# Patient Record
Sex: Female | Born: 1935 | Race: White | Hispanic: No
Health system: Southern US, Community
[De-identification: ages and names within clinical notes are randomized; demographics above are authoritative.]

---

## 1998-01-04 ENCOUNTER — Inpatient Hospital Stay (HOSPITAL_COMMUNITY): Admission: AD | Admit: 1998-01-04 | Discharge: 1998-01-08 | Payer: Self-pay | Admitting: Cardiology

## 2000-01-27 ENCOUNTER — Ambulatory Visit (HOSPITAL_COMMUNITY): Admission: RE | Admit: 2000-01-27 | Discharge: 2000-01-27 | Payer: Self-pay | Admitting: Orthopedic Surgery

## 2000-10-21 ENCOUNTER — Encounter: Payer: Self-pay | Admitting: Internal Medicine

## 2000-10-22 ENCOUNTER — Inpatient Hospital Stay (HOSPITAL_COMMUNITY): Admission: AD | Admit: 2000-10-22 | Discharge: 2000-10-23 | Payer: Self-pay | Admitting: Internal Medicine

## 2001-11-25 ENCOUNTER — Encounter: Payer: Self-pay | Admitting: Internal Medicine

## 2001-11-25 ENCOUNTER — Ambulatory Visit (HOSPITAL_COMMUNITY): Admission: RE | Admit: 2001-11-25 | Discharge: 2001-11-25 | Payer: Self-pay | Admitting: Internal Medicine

## 2001-12-13 ENCOUNTER — Encounter: Payer: Self-pay | Admitting: Internal Medicine

## 2001-12-13 ENCOUNTER — Ambulatory Visit (HOSPITAL_COMMUNITY): Admission: RE | Admit: 2001-12-13 | Discharge: 2001-12-13 | Payer: Self-pay | Admitting: Internal Medicine

## 2002-10-29 ENCOUNTER — Emergency Department (HOSPITAL_COMMUNITY): Admission: EM | Admit: 2002-10-29 | Discharge: 2002-10-29 | Payer: Self-pay | Admitting: Emergency Medicine

## 2003-09-06 ENCOUNTER — Inpatient Hospital Stay (HOSPITAL_COMMUNITY): Admission: AD | Admit: 2003-09-06 | Discharge: 2003-09-09 | Payer: Self-pay | Admitting: Internal Medicine

## 2004-09-17 ENCOUNTER — Ambulatory Visit (HOSPITAL_COMMUNITY): Admission: RE | Admit: 2004-09-17 | Discharge: 2004-09-17 | Payer: Self-pay | Admitting: Internal Medicine

## 2005-01-21 ENCOUNTER — Ambulatory Visit (HOSPITAL_COMMUNITY): Admission: RE | Admit: 2005-01-21 | Discharge: 2005-01-21 | Payer: Self-pay | Admitting: Internal Medicine

## 2005-02-12 ENCOUNTER — Inpatient Hospital Stay (HOSPITAL_COMMUNITY): Admission: EM | Admit: 2005-02-12 | Discharge: 2005-02-18 | Payer: Self-pay | Admitting: *Deleted

## 2005-03-15 ENCOUNTER — Ambulatory Visit (HOSPITAL_COMMUNITY): Admission: RE | Admit: 2005-03-15 | Discharge: 2005-03-15 | Payer: Self-pay | Admitting: Internal Medicine

## 2005-04-08 ENCOUNTER — Ambulatory Visit (HOSPITAL_COMMUNITY): Admission: RE | Admit: 2005-04-08 | Discharge: 2005-04-08 | Payer: Self-pay | Admitting: Internal Medicine

## 2005-04-24 ENCOUNTER — Other Ambulatory Visit: Admission: RE | Admit: 2005-04-24 | Discharge: 2005-04-24 | Payer: Self-pay | Admitting: Dermatology

## 2006-02-15 ENCOUNTER — Emergency Department (HOSPITAL_COMMUNITY): Admission: EM | Admit: 2006-02-15 | Discharge: 2006-02-15 | Payer: Self-pay | Admitting: Emergency Medicine

## 2006-02-20 ENCOUNTER — Ambulatory Visit (HOSPITAL_COMMUNITY): Admission: RE | Admit: 2006-02-20 | Discharge: 2006-02-20 | Payer: Self-pay | Admitting: Internal Medicine

## 2006-07-20 ENCOUNTER — Ambulatory Visit (HOSPITAL_COMMUNITY): Admission: RE | Admit: 2006-07-20 | Discharge: 2006-07-20 | Payer: Self-pay | Admitting: Internal Medicine

## 2007-01-22 ENCOUNTER — Ambulatory Visit (HOSPITAL_COMMUNITY): Admission: RE | Admit: 2007-01-22 | Discharge: 2007-01-22 | Payer: Self-pay | Admitting: Internal Medicine

## 2007-02-12 ENCOUNTER — Ambulatory Visit (HOSPITAL_COMMUNITY): Admission: RE | Admit: 2007-02-12 | Discharge: 2007-02-12 | Payer: Self-pay | Admitting: Internal Medicine

## 2007-03-12 ENCOUNTER — Ambulatory Visit (HOSPITAL_COMMUNITY): Admission: RE | Admit: 2007-03-12 | Discharge: 2007-03-12 | Payer: Self-pay | Admitting: Pulmonary Disease

## 2007-05-30 ENCOUNTER — Inpatient Hospital Stay (HOSPITAL_COMMUNITY): Admission: EM | Admit: 2007-05-30 | Discharge: 2007-06-05 | Payer: Self-pay | Admitting: Emergency Medicine

## 2008-07-21 IMAGING — CT CT ANGIO CHEST
2 of 4 series · 19 of 36 positions shown · IV contrast (CONTRAST)
Comparison: CT chest on 02/12/05 and chest x-ray today.

CLINICAL DATA: 74-year-old with shortness of breath.  Sick for a week.   Increased D-dimer.  No prior malignancy or surgery. 
 CT ANGIOGRAPHY OF CHEST:
TECHNIQUE: Multidetector CT imaging of the chest was performed during bolus injection of intravenous contrast.  Multiplanar CT angiographic image reconstructions were generated to evaluate the vascular anatomy.
 Contrast:  150 cc Omnipaque 300.

[Series 9737: — · axial · 0.75mm/px · z∈[+1634,+1853]mm · 16 of 403 slices shown (1 of 2)]
[im 19/403  lung]
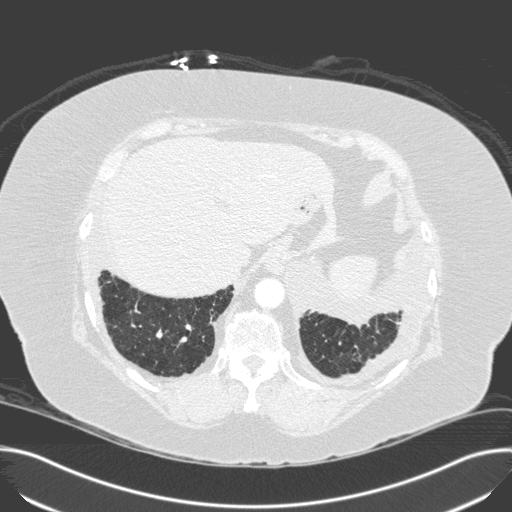
[im 37/403  mediastinal]
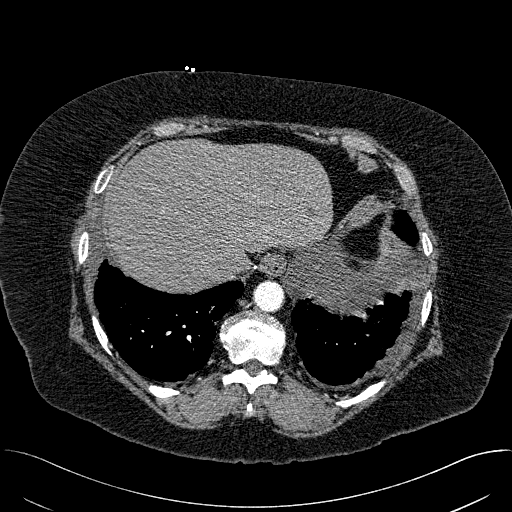
[im 74/403  lung]
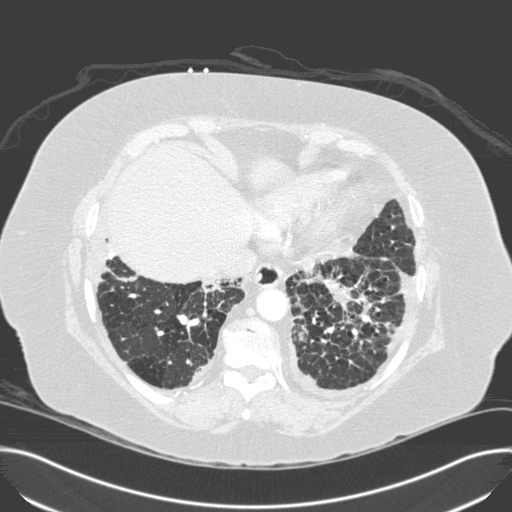
[im 92/403  mediastinal]
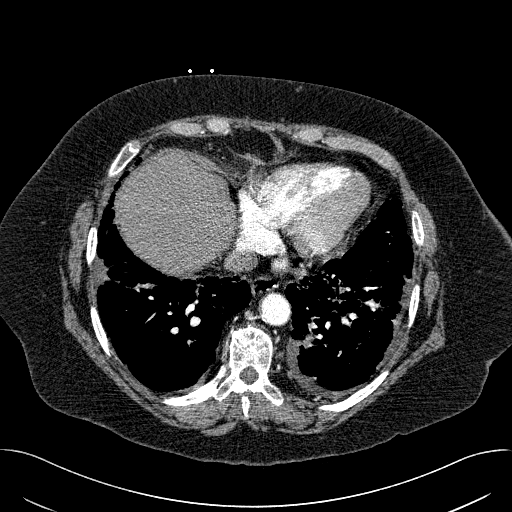
[im 110/403  lung]
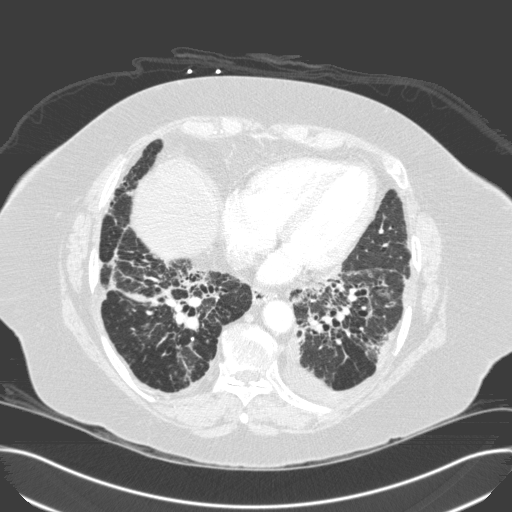
[im 147/403  mediastinal]
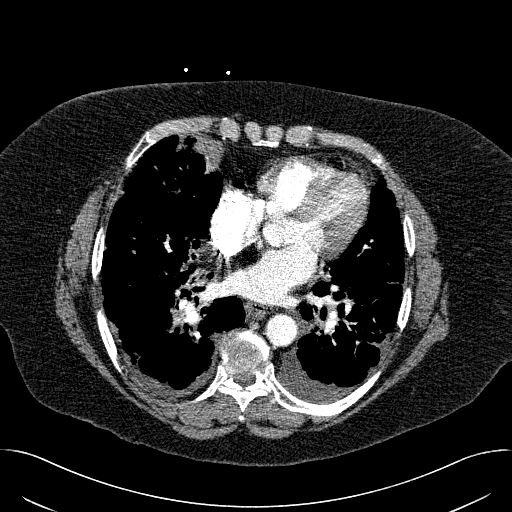
[im 165/403  lung]
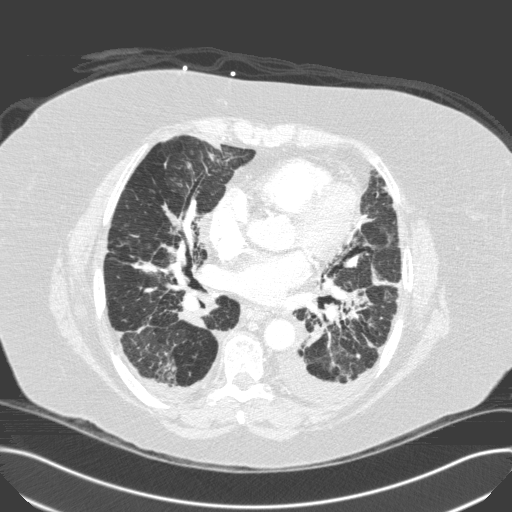
[im 183/403  mediastinal]
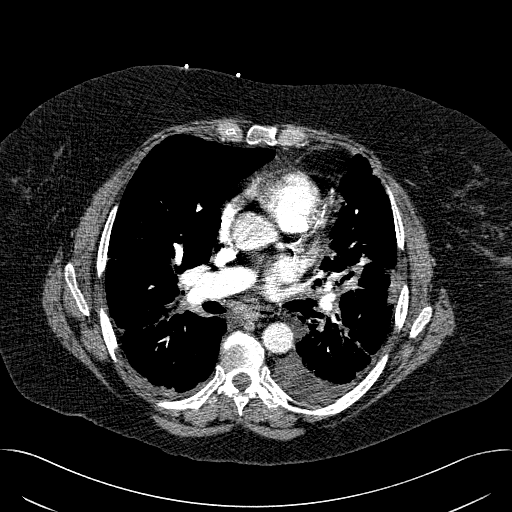
[im 220/403  lung]
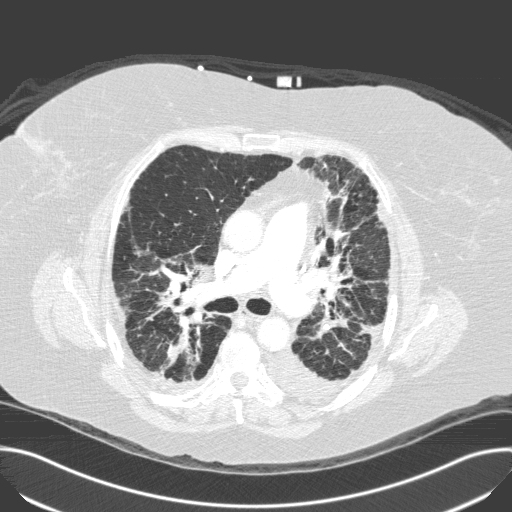
[im 238/403  mediastinal]
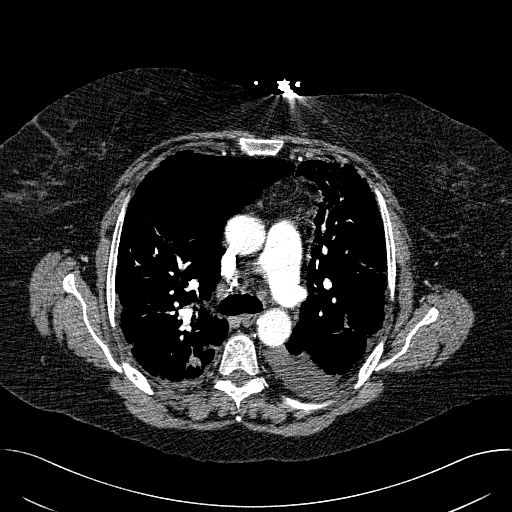
[im 256/403  lung]
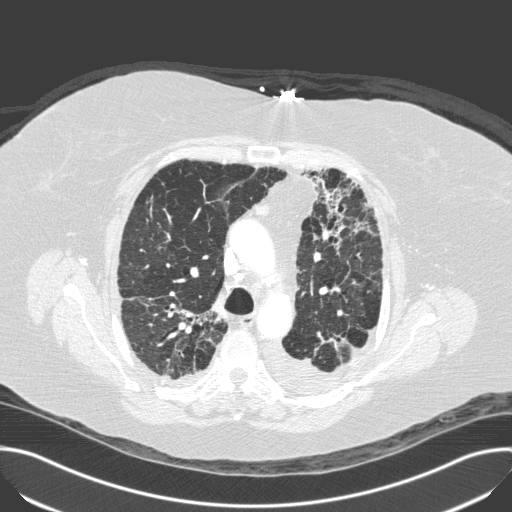
[im 293/403  mediastinal]
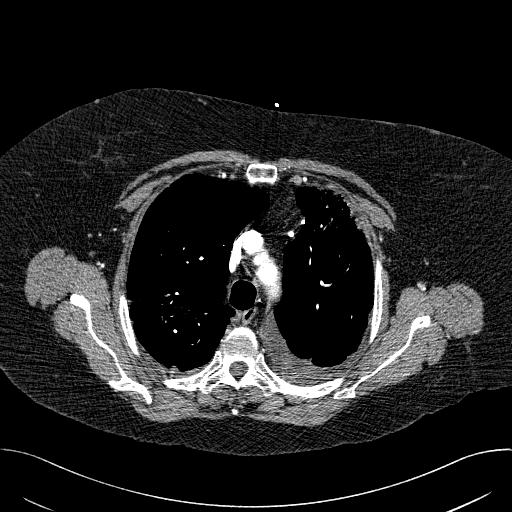
[im 311/403  lung]
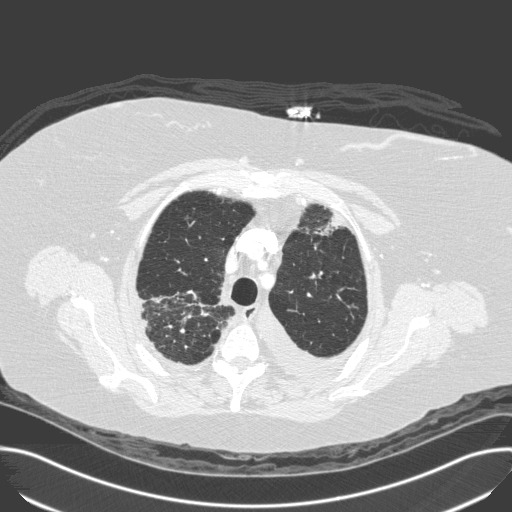
[im 329/403  mediastinal]
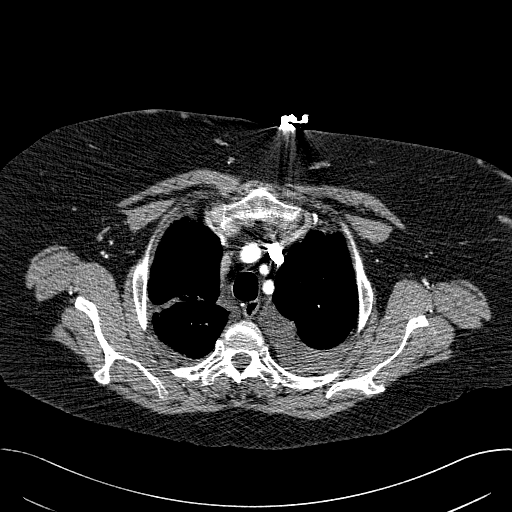
[im 366/403  lung]
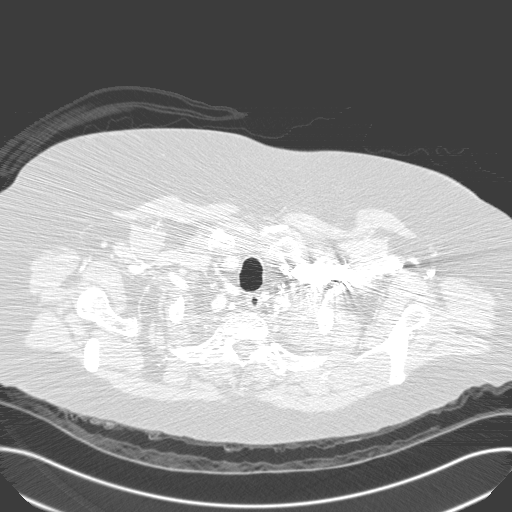
[im 384/403  mediastinal]
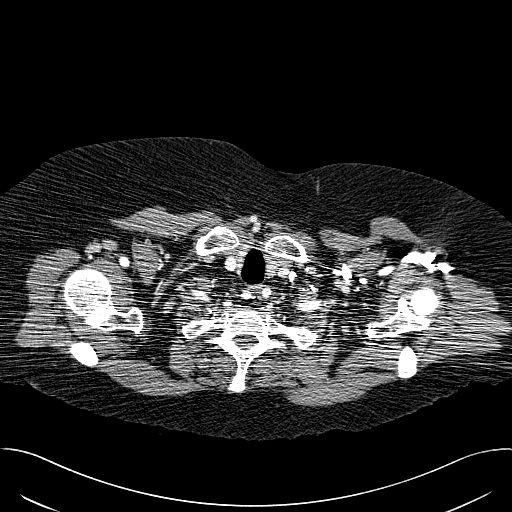

[— · coronal · 0.47mm/px · 3 of 70 slices shown (2 of 2)]
[im 14/70  mediastinal]
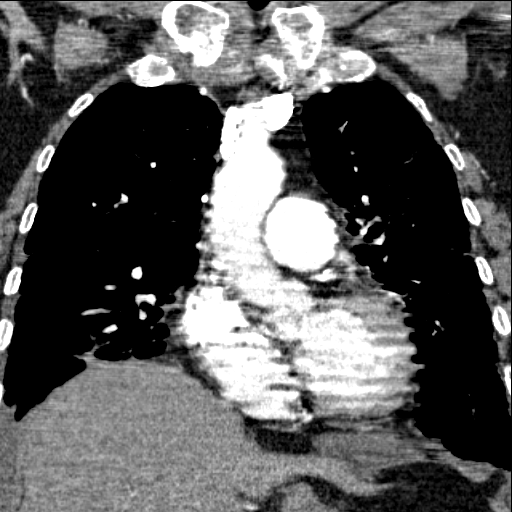
[im 28/70  mediastinal]
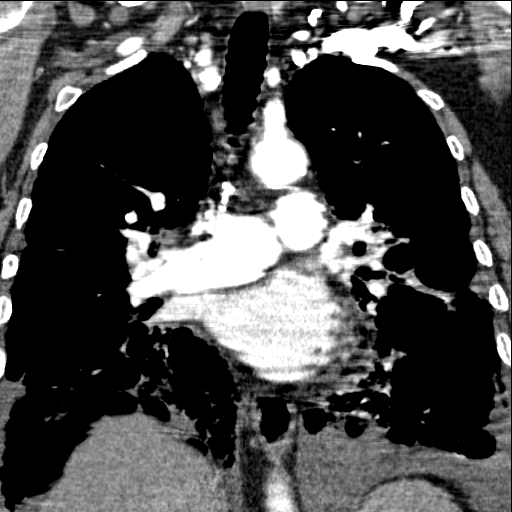
[im 42/70  mediastinal]
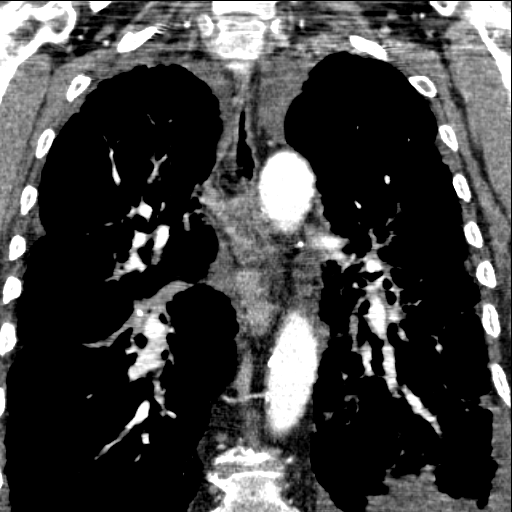

[19 of 36 positions shown; findings below may reference images not displayed]

FINDINGS: Again noted are chronic lung changes.  Bilateral pleural effusions are again noted, smaller on the left than on the previous exam.  Vessels are well opacified, and there is no evidence for acute pulmonary embolus.  Scattered, small mediastinal lymph nodes are identified.  Images of the upper abdomen are unremarkable.
IMPRESSION: 1.  Chronic lung disease. 
 2.  No evidence for acute pulmonary embolus.

## 2010-07-28 ENCOUNTER — Encounter: Payer: Self-pay | Admitting: Internal Medicine

## 2010-11-19 NOTE — Group Therapy Note (Signed)
[name], [name]                 ACCOUNT NO.:  [id number]   MEDICAL RECORD NO.:  [id number]          PATIENT TYPE:  INP   LOCATION:  IC07                          FACILITY:  [facility]   PHYSICIAN:  Edward L. Juanetta Gosling, M.D.DATE OF BIRTH:  [birth year]   DATE OF PROCEDURE:  DATE OF DISCHARGE:                                 PROGRESS NOTE   Ms. Wiseman is more alert.  She is off sedation right now.  She shakes her  head that she does not have any complaints.  Her pulse is in the 70s, O2  sat 97%, respirations 15, blood pressure 128/80.  She still has some  rhonchi.  Her heart is regular.  Her lab work:  White count is 4600,  hemoglobin is 10.4, platelets down to 80,000.  BUN is down to 41,  creatinine is 1.33 much improved.  Her blood gas this morning shows on  40% 450, rate of 12, 5 of PEEP, pH is 7.35, pCO2 of 52, pO2 of 112.  It  think that is probably about her baseline except for the oxygenation.  Chest x-ray really shows no significant change.   ASSESSMENT:  She is better.  She does have a low platelet count.  I am  going to go ahead and check heparin-associated thrombocytopenia panel.  I am going to go ahead and see if we can do any weaning.  I do not plan  to extubate her today, but I will see if she can wean at all.  She will  need to stay back on the ventilator after she is weaned some today  because of her secretions.  Otherwise she is doing better.      Edward L. Juanetta Gosling, M.D.  Electronically Signed     ELH/MEDQ  D:  06/03/2007  T:  06/03/2007  Job:  [id number]

## 2010-11-19 NOTE — Group Therapy Note (Signed)
[name], [name]                 ACCOUNT NO.:  [id number]   MEDICAL RECORD NO.:  [id number]          PATIENT TYPE:  INP   LOCATION:  IC07                          FACILITY:  [facility]   PHYSICIAN:  Edward L. Juanetta Gosling, M.D.DATE OF BIRTH:  18-Mar-1932   DATE OF PROCEDURE:  DATE OF DISCHARGE:                                 PROGRESS NOTE   SUBJECTIVE:  Ms. Hastings actually bit through her endotracheal tube balloon  today.  She was then extubated.  Since she had been extubated, she has  not been responsive and her blood gas shows on 100% O2, pH is 7.045,  pCO2 is 111, pO2 of 102.  I have discussed all this with her daughter  who is at bedside and told her that if we do not reintubate her, she is  very likely to die.  We discussed her known severe pulmonary disease and  their thought is not to reintubated and to go ahead and put her on some  BiPAP; I think that is reasonable.  She is going to discuss it with the  rest of the family in the meantime.  She has been in respiratory failure  related to sarcoidosis and a Pseudomonas pneumonia.   OBJECTIVE:  Her blood pressure is 166/95.  Her blood gas is as  mentioned.  Electrolytes show her sodium is 147, CO2 of 33, BUN of 42,  creatinine 1.04.  She is afebrile.  I&O +2368 and 646.   ASSESSMENT:  She has got severe problems of course.   PLAN:  She is to probably not reintubate, try the BiPAP and see what she  does.      Edward L. Juanetta Gosling, M.D.  Electronically Signed     ELH/MEDQ  D:  06/05/2007  T:  06/05/2007  Job:  [id number]

## 2010-11-19 NOTE — Group Therapy Note (Signed)
NAMECAROLINE, [name]                 ACCOUNT NO.:  [id number]   MEDICAL RECORD NO.:  [id number]          PATIENT TYPE:  INP   LOCATION:  IC07                          FACILITY:  [facility]   PHYSICIAN:  Edward L. Juanetta Gosling, M.D.DATE OF BIRTH:  1932/04/29   DATE OF PROCEDURE:  DATE OF DISCHARGE:                                 PROGRESS NOTE   Ms. Test is I think about the same, although her blood gas does not look  as good.  She has no new complaints.  She is coughing.  She is  congested.  She is growing gram-negative rods in her sputum.  Her chest  shows significant rhonchi.  Her heart is regular with a fairly slow  heart rate. She is now off of atenolol which probably she should stay  off of. Her heart rate is in the 60s. Blood pressure 135/80. She has  significant rhonchi.  Her blood gas this morning on BiPAP and 4 liters  of O2 shows pH 7.17, pCO2 of 95, pO2 of 85.  CBC shows white count 7300,  hemoglobin 12.5, platelets 118, BUN is 39, creatinine 2.37.   ASSESSMENT:  She has had significant change in her renal function.  She  has gram-negative rods in her sputum.  She has had very poor response to  BiPAP so I think we should go ahead and get her off of the BiPAP put her  back on nasal O2, have her do a flutter valve and see if any of this  helps.  Will get another blood gas about 2 hours after she is on the O2.  She can have some full liquids today.  We may need to add an immuno  __________ although renal function __________ because of the gram-  negative rods until we have and identification.      Edward L. Juanetta Gosling, M.D.  Electronically Signed     ELH/MEDQ  D:  06/01/2007  T:  06/01/2007  Job:  [id number]

## 2010-11-19 NOTE — Group Therapy Note (Signed)
[name], [name]                 ACCOUNT NO.:  [id number]   MEDICAL RECORD NO.:  [id number]          PATIENT TYPE:  INP   LOCATION:  IC07                          FACILITY:  [facility]   PHYSICIAN:  Edward L. Juanetta Gosling, M.D.DATE OF BIRTH:  02/25/1932   DATE OF PROCEDURE:  DATE OF DISCHARGE:                                 PROGRESS NOTE   [name] continued having difficulty yesterday and as per my written  progress note, she ended up with increasing CO2, worsening problems with  confusion and ended up having to be intubated.  She has had a large  amount of sputum suctioned.  She did receive a small amount of dopamine.  She is off now.  Blood pressure now 122/60, pulse 66, temperature is  36.4.  The patient's INR +26, 94 yesterday -82 so far.  Her exam shows  she still has significant rhonchi.  She is sedated.  Her heart is  regular.  Her laboratory work:  White count is 7300, hemoglobin 12.5,  platelets 118.  Her pH is 7.41, pCO2 of 44, pO2 of 104 on 50% 450 rate  of 15, 5 of PEEP.   ASSESSMENT:  She is better.   PLAN:  We are going to continue working on trying to get her secretions  controlled and try to get her more stable.  I do not think she will be  able to be extubated even if she has good weaning parameters because of  the problems secretions.      Edward L. Juanetta Gosling, M.D.  Electronically Signed     ELH/MEDQ  D:  06/02/2007  T:  06/02/2007  Job:  [id number]

## 2010-11-19 NOTE — Consult Note (Signed)
NAMEKEYARIA, LAWSON                 ACCOUNT NO.:  [id number]   MEDICAL RECORD NO.:  [id number]          PATIENT TYPE:  INP   LOCATION:  IC07                          FACILITY:  [facility]   PHYSICIAN:  [name], M.D.DATE OF BIRTH:  09/13/1931   DATE OF CONSULTATION:  05/31/2007  DATE OF DISCHARGE:                                 CONSULTATION   [name] is a 77 year old who has multiple medical problems.  She has  longstanding pulmonary sarcoidosis with increasing respiratory  difficulty.  She had been worse for the last month; she had been on home  oxygen.  She has had multiple change in her medications, none of which  have made a great deal of difference.  She was brought to the emergency  room.  She is on 100% O2 and she had marked CO2 retention.  She was  started on BiPAP but continued having difficulty, and eventually had to  come to the intensive care unit.   PAST MEDICAL HISTORY:  Positive for sarcoidosis, gout, hypertension; and  she has what is likely cor pulmonale.   HOME MEDICATIONS:  1. Verapamil 180 mg daily.  2. Aspirin 325 mg daily.  3. Colchicine 0.6 b.i.d.  4. Allopurinol 300 mg daily.  5. Atenolol 50 mg daily.  6. Ultracet as needed.  7. Omnicef 300 mg b.i.d.   SOCIAL HISTORY:  She does not use tobacco.  She does not use alcohol.  She does not use any illicit drugs.   REVIEW OF SYSTEMS:  She had been coughing.  She had been congested.   PHYSICAL EXAMINATION:  She is on BiPAP now.  She is able to talk a  little bit.  Her pulse is in the 60s, blood pressure 134/66,  respirations 28.  CHEST:  With some rhonchi bilaterally.  Her heart is regular.  ABDOMEN:  Abdomen is soft.  EXTREMITIES:  Showed trace to 1+ to 2+ edema bilaterally.   LABORATORY WORK:  Blood gas done at about 8 o'clock:  pH 7.24, pCO2 of  83, pO2 of 71 -- this is an improvement from earlier at 7.209, pCO2 of  93, pO2 of 36.   ASSESSMENT:  A chest x-ray shows perhaps a new infiltrate in the  right  lower lobe, which could be infection or could be progression of her  fibrotic lung disease.  BNP was up, which may be related to her lung  disease; but she may have some heart problem as well.  She is currently  on steroids, insulin, Albuterol and Atrovent, and on her other  medications as before.   Because of the concern about her possible infection, I agree with Dr.  Alonza Stephenson plan to go ahead and put her on Rocephin instead of on the oral  Ceftin that she is on now.  I will repeat another blood gas at about 2  pm.      [name]. Juanetta Stephenson, M.D.  Electronically Signed     ELH/MEDQ  D:  05/31/2007  T:  05/31/2007  Job:  [id number]

## 2010-11-19 NOTE — Group Therapy Note (Signed)
[name], [name]                 ACCOUNT NO.:  [id number]   MEDICAL RECORD NO.:  [id number]          PATIENT TYPE:  INP   LOCATION:  IC07                          FACILITY:  [facility]   PHYSICIAN:  Edward L. Juanetta Gosling, M.D.DATE OF BIRTH:  1932-02-05   DATE OF PROCEDURE:  DATE OF DISCHARGE:                                 PROGRESS NOTE   PROBLEM:  Respiratory failure, sarcoidosis with interstitial lung  disease, a Pseudomonas pneumonia.   SUBJECTIVE:  [name] remains intubated and on the ventilator.  Apparently, she had been agitated through the night and difficult to  keep her from trying to pull her endotracheal tube, etc.   Her exam shows she has significant rhonchi bilaterally.  Heart rate is  78 regular, blood pressure 147/68, respirations 13.  Her chest with  rhonchi bilaterally.  Abdomen is soft.   Her electrolytes now show a BUN of 40 and creatinine of 1.11.  Electrolytes normal.  CBC shows white count 6,100, hemoglobin 11,  platelets 83,000.   Her chest x-ray shows little change.   ASSESSMENT:  She has multiple problems including respiratory failure  that is due to pneumonia as well as her interstitial lung disease.   PLAN:  Have her take maybe some Diprivan now.  We could not give her  anything like that earlier because her blood pressure was too low.  I  will see if she is weanable; I doubt it.  We will see what she can do  and then decide about Diprivan.  I will go ahead and order a heparin-  associated thrombocytopenia profile.      Edward L. Juanetta Gosling, M.D.  Electronically Signed     ELH/MEDQ  D:  06/04/2007  T:  06/04/2007  Job:  [id number]

## 2010-11-19 NOTE — Procedures (Signed)
[name], [name]                 ACCOUNT NO.:  [id number]   MEDICAL RECORD NO.:  [id number]          PATIENT TYPE:  OUT   LOCATION:  RAD                           FACILITY:  [facility]   PHYSICIAN:  Edward L. Juanetta Gosling, M.D.DATE OF BIRTH:  [birth year]   DATE OF PROCEDURE:  03/12/2007  DATE OF DISCHARGE:  03/12/2007                            PULMONARY FUNCTION TEST   PULMONARY FUNCTION TEST:  1. Spirometry shows a severe ventilatory defect with evidence of      airflow obstruction.  2. Lung volumes show reduction of total lung capacity which is      moderate.  3. DLCO is severely reduced.  4. Arterial blood gases show resting hypoxemia and elevated pCO2 with      normal pH, suggesting chronic CO2 retention and chronic respiratory      failure.  5. After inhaled bronchodilator, there is improvement that nearly      reaches significance.      Edward L. Juanetta Gosling, M.D.  Electronically Signed     ELH/MEDQ  D:  03/15/2007  T:  03/16/2007  Job:  [id number]   cc:   Kingsley Callander. Ouida Sills, MD  Fax: [phone]

## 2010-11-19 NOTE — H&P (Signed)
NAMESHERESA, Stephenson                 ACCOUNT NO.:  [id number]   MEDICAL RECORD NO.:  [id number]          PATIENT TYPE:  INP   LOCATION:  IC07                          FACILITY:  [facility]   PHYSICIAN:  Tesfaye D. Felecia Shelling, MD   DATE OF BIRTH:  22-Apr-1932   DATE OF ADMISSION:  05/30/2007  DATE OF DISCHARGE:  LH                              HISTORY & PHYSICAL   CHIEF COMPLAINT:  Worsening shortness of breath.   HISTORY OF PRESENT ILLNESS:  This is a 77 year old female patient of Dr.  Carylon Perches with a history of longstanding sarcoidosis, brought to  emergency room with shortness of breath.  The patient had worsening of  shortness for the last 1 month.  She was started on home oxygen.  The  patient has been in frequent contact with Dr. Ouida Sills and her medication  has been adjusted.  She was also started on an oral steroid.  However,  in the last 2-3 days the patient became more short of breath.  EMS was  called and the patient was brought to emergency room.  On her arrival  the patient was on 100% oxygen and her ABG showed significant CO2  retention with some acidosis.  Her oxygen was decreased and the patient  was started on BiPAP machine.  Repeat blood gas slightly improved.  The  patient is more comfortable and awake.  She is also started on IV  steroids.  The patient is going to be admitted for further management.   REVIEW OF SYSTEMS:  Patient has intermittent cough with some whitish  sputum.  No fever or chills, chest pain, nausea, vomiting, abdominal  pain, dysuria, urgency or frequency of urination.   PAST MEDICAL HISTORY:  1. Sarcoidosis.  2. Gouty arthritis.  3. Congestive heart failure.  4. Hypertension.   CURRENT MEDICATIONS:  1. Verapamil 180 mg daily.  2. Aspirin 325 mg daily.  3. Colchicine 0.6 mg b.i.d.  4. Allopurinol 300 mg daily.  5. Atenolol 50 mg daily.  6. Ultracet one tablet p.o. p.r.n.  7. Omnicef 300 mg b.i.d.   SOCIAL HISTORY:  The patient has no history of  alcohol, tobacco or  substance abuse.   PHYSICAL EXAMINATION:  The patient is alert, awake and sick-looking on a  BiPAP machine.  The patient has labored breathing.  However, she is not  in acute distress.  VITAL SIGNS:  Blood pressure 101/63, pulse 68, respiratory rate 32,  temperature 99 degrees Fahrenheit.  HEENT:  Pupils are equal and reactive.  NECK:  Supple.  CHEST:  Poor air entry, bilaterally expiratory wheezes and scattered  rhonchi.  CARDIOVASCULAR:  First and second heart sound heard, regular.  ABDOMEN:  Grossly obese, soft and lax.  Bowel sounds positive.  No mass  or organomegaly.  EXTREMITIES: 2+ edema.   LABS ON ADMISSION:  ABG on 100% non-rebreather mask, pH 7.26, pCO2 87.2,  pO2 271.  Sodium 135, potassium 4.23, calcium 92, carbon dioxide 36,  glucose of 106, BUN 17, creatinine 1.3, calcium 8.9.  CBC:  WBC 8.2,  hemoglobin 12.2, hematocrit 37.8 and platelets  129.  Repeat ABG on 60%  FIO2, pH 7.3, pCO2 30, pO2 176 and saturation 99%.   ASSESSMENT:  1. Respiratory insufficiency secondary to longstanding history of      sarcoidosis.  2. Carbon dioxide CO2 retention secondary to the above.  3. History of congestive heart failure.  4. History of hypertension.  5. History of gouty arthritis.   PLAN:  Will admit the patient under telemetry for concentrated care.  Will continue on BiPAP machine and will repeat her blood gas in the  morning.  Will start the patient on IV steroids.  Will continue  nebulizer treatments.  Will continue her regular treatment and  supportive care.      Tesfaye D. Felecia Shelling, MD  Electronically Signed     TDF/MEDQ  D:  05/30/2007  T:  05/31/2007  Job:  [id number]

## 2010-11-22 NOTE — H&P (Signed)
NAME:  [name], [name]                           ACCOUNT NO.:  [id number]   MEDICAL RECORD NO.:  [id number]                   PATIENT TYPE:  INP   LOCATION:  A311                                 FACILITY:  [facility]   PHYSICIAN:  Kingsley Callander. Ouida Sills, M.D.                  DATE OF BIRTH:  16-Jul-1931   DATE OF ADMISSION:  09/06/2003  DATE OF DISCHARGE:                                HISTORY & PHYSICAL   CHIEF COMPLAINT:  Left foot swelling.   HISTORY OF PRESENT ILLNESS:  This patient is a 77 year old white female who  presented with a 2- to 3-day history of swelling, redness, and breakdown of  a callus on the ball of the left foot.  She was seen initially in the office  and was found to have extensive swelling involving her forefoot.  She had  streaking up the foot to the ankle area.  She had an open callus with serous  drainage over the ball of the foot but had no pus expressed.  She states  that the area had actually been even more swollen the night before.  She  denied any trauma to this area.  She has a history of gout but has been on  allopurinol and colchicine and has not previously had a gouty tophus.  There  has been no trauma to her foot.  She has a history of venous insufficiency  and edema in the lower legs.  She has had bouts of cellulitis in her lower  legs in the past.   PAST MEDICAL HISTORY:  1. Sarcoidosis/pulmonary fibrosis.  2. Chronic lymphedema/venous insufficiency.  3. 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  4. Status post non-Q-wave MI in 1999.  Her catheterization, though, showed     normal coronary arteries.  5. Elevated creatinine to 2 on Prinivil which resolved with discontinuation.  6. Status post cholecystectomy.  7. History of multiple episodes of cellulitis in the legs.  8. Gout.  9. Chronic obesity.  10.      Hypertension.   MEDICATIONS:  1. Allopurinol 300 mg daily.  2. Colchicine 0.6 mg b.i.d.  3. Atenolol 50 mg b.i.d.  4. Lasix 40 mg b.i.d.  5.  Verapamil 360 mg daily.  6. Foradil b.i.d.  7. Pulmicort 3 puffs b.i.d.  8. Ultracet p.r.n., usually b.i.d.   ALLERGIES:  She is intolerant of HYDROCODONE.   SOCIAL HISTORY:  She does not smoke cigarettes or drink alcohol.   FAMILY HISTORY:  Her mother died of old age.  Her father had an MI.   REVIEW OF SYSTEMS:  She has had no recent increased difficulty breathing.  She denies chest pain or abdominal pain.  She has had no recent recurrent  cellulitis episodes in the extremities.  She denies difficulty voiding or  any difficulty with her bowel habits.   PHYSICAL EXAMINATION:  VITAL SIGNS:  Blood pressure 112/72,  temperature  98.6, pulse 76.  GENERAL:  Alert and fully oriented, overweight.  HEENT:  Eyes and oropharynx are unremarkable.  NECK:  Supple.  Without thyromegaly.  LUNGS:  Bilateral crackles.  HEART:  Regular, with no murmurs.  ABDOMEN:  Obese, nontender, with no hepatosplenomegaly.  EXTREMITIES:  Chronic edema in the lower legs.  The left foot is markedly  red, swollen, and warm.  She has a nickel-sized callus which has broken down  and is oozing serous-type fluid.  She has palpable pulses in the feet.  She  has greater edema in the left leg than the right, which is chronic.  NEUROLOGIC:  At baseline.   LABORATORY DATA:  White count 7.8, hemoglobin 12.2, platelets 144.  Sedimentation rate 32.  Uric acid 4.5.  Sodium 133, potassium 3.6, glucose  121, BUN 19, creatinine 1.2, albumin 3.1.   An x-ray of the foot reveals soft-tissue swelling but no bone involvement.   IMPRESSION:  1. Soft-tissue infection of the left foot:  She is being hospitalized for     treatment with IV Ancef.  The foot will need to be elevated, and the open     area will be covered with gauze and changed twice a day.  Will consult     the orthopedist to make sure no drainage would be required.  2. Sarcoidosis/pulmonary fibrosis:  Continue Pulmicort and Foradil.  3. Chronic edema:  Continue Lasix.   4. Osteoarthritis:  Continue Ultracet.  5. Hypertension:  Continue atenolol and verapamil.  6. Gout:  Continue allopurinol and colchicine.     ___________________________________________                                         Kingsley Callander. Ouida Sills, M.D.   ROF/MEDQ  D:  09/07/2003  T:  09/07/2003  Job:  [id number]

## 2010-11-22 NOTE — Group Therapy Note (Signed)
NAMECAYLIN, NASS                 ACCOUNT NO.:  [id number]   MEDICAL RECORD NO.:  [id number]          PATIENT TYPE:  INP   LOCATION:  A313                          FACILITY:  [facility]   PHYSICIAN:  Kingsley Callander. Ouida Sills, MD       DATE OF BIRTH:  Oct 14, 1931   DATE OF PROCEDURE:  02/16/2005  DATE OF DISCHARGE:                                   PROGRESS NOTE   Mrs. Balli is breathing more comfortably.  She does not feel back to her  baseline.  She is coughing but still is producing no sputum.  She complains  of diarrhea over the past 3 days.  She has been on antibiotic therapy now  for 5 days as of today.  She has no fever.  Vital signs are stable.   LUNGS:  Scattered crackles.  HEART:  Regular with no murmurs.  EXTREMITIES:  No edema.   IMPRESSION:  1.  Pneumonia, improving.  Her white count is 9.3.  Continue current      antibiotic therapy.  Her ABG on room air reveals a pH of 7.36, pCO2 of      52 and a pO2 of 56.  We will continue oxygen at 2 L by nasal cannula.      Her hemoglobin is 11.4.  2.  Azotemia, improved.  Her BUN has dropped from 34 to 21 and creatinine      from 1.3 to 1.  Her potassium has improved to 4.1.  3.  Diarrhea.  We will obtain stool studies and hold her colchicine for now.       ROF/MEDQ  D:  02/16/2005  T:  02/16/2005  Job:  [id number]

## 2010-11-22 NOTE — Discharge Summary (Signed)
NAMEADALY, PUDER                 ACCOUNT NO.:  [id number]   MEDICAL RECORD NO.:  [id number]          PATIENT TYPE:  INP   LOCATION:  A313                          FACILITY:  [facility]   PHYSICIAN:  Kingsley Callander. Ouida Sills, MD       DATE OF BIRTH:  [birth year]   DATE OF ADMISSION:  02/12/2005  DATE OF DISCHARGE:  08/15/2006LH                                 DISCHARGE SUMMARY   DISCHARGE DIAGNOSES:  1.  Pneumonia.  2.  Pulmonary fibrosis.  3.  Hypertension.  4.  Gout.  5.  Osteoarthritis.  6.  Chronic lymphedema.  7.  History of non-Q wave myocardial infarction.   HOSPITAL COURSE:  This patient is a 77 year old, white female with pulmonary  fibrosis who presented with shortness of breath and cough.  She was unable  to produce any sputum.  She was hypoxic.  She had bilateral infiltrates on a  chest CT.  Her D-dimer was elevated, but there was no evidence of pulmonary  embolism.  She was started on IV Rocephin and Zithromax.  She gradually  improved.  Her oxygen levels improved to a pO2 of 59 on room air.  She  developed diarrhea, but stool studies were negative.  Her initial white  count was 13.8, but improved to 9.3.   Blood cultures were negative.  Sputum culture revealed normal flora.   Zithromax was continued through 5 days.  Rocephin is being switched to  [facility] at discharge.   FOLLOW UP:  She will be seen in followup in my office in 2 weeks.   DISCHARGE MEDICATIONS:  1.  Omnicef 300 mg b.i.d. x7 more days.  2.  Prednisone 10 mg, 2, 1, 1/2 at 3 day intervals.  3.  Aspirin 325 mg daily.  4.  Verapamil SR 240 mg daily.  5.  Allopurinol 300 mg daily.  6.  Lasix 40 mg daily p.r.n.  7.  Atenolol 50 mg b.i.d.  8.  Ultram 1-2 t.i.d. p.r.n.  9.  Zantac 150 mg b.i.d.  10. Colchicine 0.6 mg b.i.d.      Kingsley Callander. Ouida Sills, MD  Electronically Signed     ROF/MEDQ  D:  02/18/2005  T:  02/18/2005  Job:  [id number]

## 2010-11-22 NOTE — H&P (Signed)
NAMESTEPFANIE, YOTT                 ACCOUNT NO.:  [id number]   MEDICAL RECORD NO.:  [id number]          PATIENT TYPE:  INP   LOCATION:  A313                          FACILITY:  [facility]   PHYSICIAN:  Kingsley Callander. Ouida Sills, MD       DATE OF BIRTH:  1931-12-21   DATE OF ADMISSION:  02/12/2005  DATE OF DISCHARGE:  LH                                HISTORY & PHYSICAL   CHIEF COMPLAINT:  Shortness of breath.   HISTORY OF PRESENT ILLNESS:  This patient is a 77 year old white female who  presented to the emergency room with a three-day history of shortness of  breath, cough and wheezing.  She had no significant sputum production.  She  has a history of sarcoidosis and interstitial lung disease.  She uses  bronchodilators and corticosteroids chronically.  She last had a chest x-ray  on July 18 which revealed COPD and fibrotic interstitial changes.  She was  evaluated in the emergency room though and found to have a hypoxia and  bilateral infiltrates on her chest CT scan consistent with pneumonia.  She  had an elevated D-dimer but there was no sign of pulmonary embolism.   PAST MEDICAL HISTORY:  1.  Pulmonary fibrosis/sarcoidosis.  2.  Non-Q wave myocardial infarction in 1999 with subsequent normal      coronaries by catheterization.  3.  Rising creatinine to 2.0 on Prinivil.  4.  Osteoarthritis with end-stage disease in the knees.  5.  Chronic lymphedema.  6.  Hypertension.  7.  Lower extremity cellulitis.  8.  Gout.  9.  Cholecystectomy.   MEDICATIONS:  1.  Asmanex 220 daily.  2.  Verapamil SR 240 mg daily.  3.  Ranitidine 150 mg b.i.d.  4.  Ultram t.i.d. p.r.n.  5.  Allopurinol 300 mg daily.  6.  Colchicine 0.6 mg b.i.d.  7.  Atenolol 50 mg b.i.d.  8.  Lasix 40 mg daily.  9.  Aspirin 325 mg daily.   ALLERGIES:  HYDROCODONE.   SOCIAL HISTORY:  She does not smoke or drink.   FAMILY HISTORY:  Her mother died of old age.  Her father had an MI.   REVIEW OF SYSTEMS:  No definite fevers.  No  chest pain.  No leg pain.   PHYSICAL EXAMINATION:  VITAL SIGNS:  Blood pressure 129/60, pulse 84,  respirations 28, temperature 99.8.  GENERAL:  Alert, obese, dyspneic-appearing.  HEENT:  Eyes and oropharynx unremarkable.  NECK:  No JVD or thyromegaly.  LUNGS:  Bilaterally wheezes.  HEART:  Regular with no murmurs.  ABDOMEN:  Nontender.  No hepatosplenomegaly.  EXTREMITIES:  Lower extremity swelling is actually better than usual.  She  has marked degenerative changes of both knees.  The  left leg is chronically  much larger than the right.  NEUROLOGIC:  Nonfocal.  SKIN: Unremarkable.  LYMPH NODES:  No enlargement.   LABORATORY DATA:  Her ABG revealed a PH of 7.40, PCO2 39, PO2 43.  White  count 13.8, hemoglobin 12.6, platelets 192.  D-dimer 5.55.  Sodium 134, potassium 4, BUN 11, creatinine  1.1, glucose 112, albumen 2.9.  Chest x-ray revealed chronic interstitial changes.  Her chest CT scan  revealed left lower lobe and right lower lobe infiltrates and a left pleural  effusion.   IMPRESSION:  1.  Pulmonary fibrosis with pneumonia and hypoxia.  Her oxygen saturations      have improved to 94 on oxygen at 3 L by nasal cannula.  She will be      treated with Rocephin, Zithromax, inhaled bronchodilators, and IV      corticosteroids and supplemental oxygen.  2.  Hypertension controlled.  3.  History of non-Q wave myocardial infarction.  4.  History of chronic venous insufficiency and lymphedema, stable/improved.  5.  Osteoarthritis.  Continue tramadol p.r.n.       ROF/MEDQ  D:  02/13/2005  T:  02/14/2005  Job:  [id number]

## 2010-11-22 NOTE — Discharge Summary (Signed)
NAME:  MARISELA, LINE                           ACCOUNT NO.:  [id number]   MEDICAL RECORD NO.:  [id number]                   PATIENT TYPE:  INP   LOCATION:  A311                                 FACILITY:  [facility]   PHYSICIAN:  Kingsley Callander. Ouida Sills, M.D.                  DATE OF BIRTH:  1932-04-15   DATE OF ADMISSION:  09/06/2003  DATE OF DISCHARGE:  09/09/2003                                 DISCHARGE SUMMARY   DISCHARGE DIAGNOSES:  1. Left foot cellulitis.  2. Sarcoidosis/pulmonary fibrosis.  3. Chronic lymphedema/venous insufficiency.  4. Osteoarthritis.  5. History of a non-Q-wave myocardial infarction in 1999.  6. History of Prinivil-induced renal insufficiency.  7. Gout.  8. Obesity.  9. Hypertension.   DISCHARGE MEDICATIONS:  1. Cephalexin 500 mg q.6h.  2. Allopurinol 300 mg daily.  3. Colchicine 0.6 mg b.i.d.  4. Atenolol 50 mg b.i.d.  5. Lasix 40 mg b.i.d.  6. Verapamil 360 mg daily.  7. Foradil b.i.d.  8. Pulmicort 3 puffs b.i.d.  9. Ultracet p.r.n.   HOSPITAL COURSE:  This patient is a 77 year old white female who presented  with redness, swelling, warmth, and serous drainage from an open callus on  the ball of the left foot.  Her white count was 7.8.  She was afebrile.  Her  sedimentation rate was 32.  The uric acid level was 4.5.  Glucose was 121.  X-rays revealed no evidence of osteomyelitis.  She initially had redness  involving a large part of the lower foot with streaking up to the ankle.  She was treated with IV Ancef.  There is marked improvement over the initial  24 hours.  She was continued on IV antibiotics through September 09, 2003.  She  is being switched to oral cephalexin and will have follow-up in the office  in 5-6 days.   She was instructed to elevate the foot and to limit her activity while at  home.  She knows that this area is greatly improved but is still not 100%  well at this point.   She did receive DVT prophylaxis with Lovenox while hospitalized.   She was  stable from a cardiopulmonary standpoint.     ___________________________________________                                         Kingsley Callander. Ouida Sills, M.D.   ROF/MEDQ  D:  09/09/2003  T:  09/09/2003  Job:  [id number]

## 2010-11-22 NOTE — Discharge Summary (Signed)
[name], [name]                 ACCOUNT NO.:  [id number]   MEDICAL RECORD NO.:  [id number]          PATIENT TYPE:  INP   LOCATION:  IC07                          FACILITY:  [facility]   PHYSICIAN:  Kingsley Callander. Ouida Sills, MD       DATE OF BIRTH:  [birth year]   DATE OF ADMISSION:  05/30/2007  DATE OF DISCHARGE:  11/29/2008LH                               DISCHARGE SUMMARY   DISCHARGE DIAGNOSES:  1. Respiratory failure.  2. Pulmonary fibrosis.  3. Pseudomonas pneumonia.  4. Azotemia.  5. Thrombocytopenia.  6. Osteoarthritis.  7. Hypertension.   HOSPITALIZATION COURSE:  This patient was a 77 year old female with  pulmonary fibrosis and sarcoidosis, who presented with increased  shortness of breath and increased CO2 retention.  She required BiPAP  initially.  In view of persistent respiratory acidosis and CO2  retention, she required intubation.  She had a significant amount of  sputum production.  Her culture grew Pseudomonas.  She had originally  been treated with Rocephin but was switched to [place] and gentamicin.  Chest x-ray revealed bilateral infiltrates.  She developed acute renal  failure with a rise in her BUN and creatinine to 51 and 2.5.  This was  felt to be likely prerenal and was treated with IV fluids.  BUN and  creatinine improved.   Pulmonary toilet improved with intubation.   She had difficulty with restlessness requiring sedation.   She ultimately apparently bit through the tubing to the endotracheal  tube cuff.  The endotracheal tube was removed, and the decision was made  not to reintubate her.  She subsequently expired due to respiratory  failure.  A do not resuscitate order was in place.      Kingsley Callander. Ouida Sills, MD  Electronically Signed     ROF/MEDQ  D:  06/20/2007  T:  06/21/2007  Job:  [id number]

## 2011-04-15 LAB — BLOOD GAS, ARTERIAL
Acid-Base Excess: 2.5 — ABNORMAL HIGH
Acid-Base Excess: 2.8 — ABNORMAL HIGH
Acid-Base Excess: 2.9 — ABNORMAL HIGH
Acid-Base Excess: 3.8 — ABNORMAL HIGH
Acid-Base Excess: 3.8 — ABNORMAL HIGH
Acid-Base Excess: 3.9 — ABNORMAL HIGH
Acid-Base Excess: 7.2 — ABNORMAL HIGH
Acid-Base Excess: 7.5 — ABNORMAL HIGH
Acid-Base Excess: 8.4 — ABNORMAL HIGH
Bicarbonate: 28.5 — ABNORMAL HIGH
Bicarbonate: 28.6 — ABNORMAL HIGH
Bicarbonate: 28.9 — ABNORMAL HIGH
Bicarbonate: 30.8 — ABNORMAL HIGH
Bicarbonate: 33.3 — ABNORMAL HIGH
Bicarbonate: 34.6 — ABNORMAL HIGH
Bicarbonate: 38.1 — ABNORMAL HIGH
Bicarbonate: 38.1 — ABNORMAL HIGH
Delivery systems: POSITIVE
Delivery systems: POSITIVE
Delivery systems: POSITIVE
FIO2: 0.6
FIO2: 100
FIO2: 40
FIO2: 40
FIO2: 50
FIO2: 50
MECHVT: 450
MECHVT: 450
MECHVT: 450
O2 Content: 2
O2 Content: 2
O2 Content: 4
O2 Content: 7
O2 Saturation: 82.1
O2 Saturation: 85.2
O2 Saturation: 92.8
O2 Saturation: 94.5
O2 Saturation: 95.7
O2 Saturation: 97.9
O2 Saturation: 98.5
PEEP: 10
PEEP: 5
Patient temperature: 37
Patient temperature: 37
Patient temperature: 37
Patient temperature: 37
Patient temperature: 37
Patient temperature: 37
Patient temperature: 37
Pressure support: 14
RATE: 12
TCO2: 26
TCO2: 26.4
TCO2: 26.5
TCO2: 27.6
TCO2: 31
TCO2: 31.5
TCO2: 31.9
TCO2: 33.4
TCO2: 35.4
TCO2: 36.7
pCO2 arterial: 117
pCO2 arterial: 52.7 — ABNORMAL HIGH
pCO2 arterial: 54.9 — ABNORMAL HIGH
pCO2 arterial: 54.9 — ABNORMAL HIGH
pCO2 arterial: 58.4
pCO2 arterial: 72.1
pCO2 arterial: 90.3
pCO2 arterial: 93.9
pCO2 arterial: 95.1
pH, Arterial: 7.11 — CL
pH, Arterial: 7.14 — CL
pH, Arterial: 7.17 — CL
pH, Arterial: 7.201 — ABNORMAL LOW
pH, Arterial: 7.295 — ABNORMAL LOW
pH, Arterial: 7.31 — ABNORMAL LOW
pH, Arterial: 7.344 — ABNORMAL LOW
pH, Arterial: 7.357
pO2, Arterial: 102 — ABNORMAL HIGH
pO2, Arterial: 110 — ABNORMAL HIGH
pO2, Arterial: 112 — ABNORMAL HIGH
pO2, Arterial: 58.7 — ABNORMAL LOW
pO2, Arterial: 70.8 — ABNORMAL LOW
pO2, Arterial: 71.8 — ABNORMAL LOW
pO2, Arterial: 82.8
pO2, Arterial: 87.6

## 2011-04-15 LAB — BASIC METABOLIC PANEL
BUN: 40 — ABNORMAL HIGH
BUN: 41 — ABNORMAL HIGH
BUN: 42 — ABNORMAL HIGH
BUN: 47 — ABNORMAL HIGH
BUN: 51 — ABNORMAL HIGH
CO2: 28
CO2: 29
CO2: 30
CO2: 32
Calcium: 7.5 — ABNORMAL LOW
Calcium: 8 — ABNORMAL LOW
Calcium: 8.2 — ABNORMAL LOW
Calcium: 8.2 — ABNORMAL LOW
Chloride: 111
Chloride: 94 — ABNORMAL LOW
Creatinine, Ser: 1.11
Creatinine, Ser: 2.78 — ABNORMAL HIGH
GFR calc Af Amer: 20 — ABNORMAL LOW
GFR calc Af Amer: 24 — ABNORMAL LOW
GFR calc Af Amer: 60 — ABNORMAL LOW
GFR calc non Af Amer: 17 — ABNORMAL LOW
GFR calc non Af Amer: 19 — ABNORMAL LOW
GFR calc non Af Amer: 20 — ABNORMAL LOW
GFR calc non Af Amer: 39 — ABNORMAL LOW
GFR calc non Af Amer: 48 — ABNORMAL LOW
GFR calc non Af Amer: 52 — ABNORMAL LOW
Glucose, Bld: 117 — ABNORMAL HIGH
Glucose, Bld: 147 — ABNORMAL HIGH
Glucose, Bld: 151 — ABNORMAL HIGH
Glucose, Bld: 161 — ABNORMAL HIGH
Glucose, Bld: 174 — ABNORMAL HIGH
Potassium: 3.6
Potassium: 4.4
Potassium: 5.1
Sodium: 131 — ABNORMAL LOW
Sodium: 134 — ABNORMAL LOW
Sodium: 139
Sodium: 143

## 2011-04-15 LAB — EXPECTORATED SPUTUM ASSESSMENT W GRAM STAIN, RFLX TO RESP C

## 2011-04-15 LAB — CBC
HCT: 31.8 — ABNORMAL LOW
HCT: 37.8
Hemoglobin: 10.4 — ABNORMAL LOW
Hemoglobin: 11.1 — ABNORMAL LOW
Hemoglobin: 12.5
MCHC: 32.3
MCHC: 32.8
MCV: 100.7 — ABNORMAL HIGH
MCV: 101.8 — ABNORMAL HIGH
MCV: 101.8 — ABNORMAL HIGH
Platelets: 129 — ABNORMAL LOW
Platelets: 80 — ABNORMAL LOW
RBC: 3.37 — ABNORMAL LOW
RBC: 3.71 — ABNORMAL LOW
RBC: 3.81 — ABNORMAL LOW
RDW: 17.4 — ABNORMAL HIGH
WBC: 8.2

## 2011-04-15 LAB — COMPREHENSIVE METABOLIC PANEL
ALT: 27
Albumin: 3.2 — ABNORMAL LOW
Alkaline Phosphatase: 96
CO2: 36 — ABNORMAL HIGH
Calcium: 8.9
GFR calc non Af Amer: 38 — ABNORMAL LOW
Potassium: 4.3
Sodium: 135
Total Protein: 6.4

## 2011-04-15 LAB — POCT CARDIAC MARKERS
CKMB, poc: 2.5
CKMB, poc: 2.7
Myoglobin, poc: >500
Operator id: 189501
Operator id: 189501
Troponin i, poc: 0.09 — ABNORMAL HIGH

## 2011-04-15 LAB — CULTURE, RESPIRATORY W GRAM STAIN

## 2011-04-15 LAB — DIFFERENTIAL
Basophils Absolute: 0
Basophils Relative: 0
Basophils Relative: 0
Basophils Relative: 0
Eosinophils Absolute: 0 — ABNORMAL LOW
Eosinophils Absolute: 0 — ABNORMAL LOW
Eosinophils Relative: 0
Eosinophils Relative: 0
Lymphocytes Relative: 5 — ABNORMAL LOW
Lymphs Abs: 0.4 — ABNORMAL LOW
Monocytes Absolute: 0.2
Monocytes Absolute: 0.2
Monocytes Absolute: 0.3
Monocytes Relative: 4
Monocytes Relative: 5
Neutro Abs: 5.5
Neutro Abs: 6.6

## 2011-04-18 LAB — BLOOD GAS, ARTERIAL
pCO2 arterial: 53.4 — ABNORMAL HIGH
pO2, Arterial: 52.6 — ABNORMAL LOW

## 2017-05-21 ENCOUNTER — Encounter: Payer: Self-pay | Admitting: Internal Medicine

## 2017-05-21 NOTE — Progress Notes (Signed)
This encounter was created in error - please disregard.
